# Patient Record
Sex: Female | Born: 1940 | Race: White | Hispanic: No | Marital: Married | Smoking: Never smoker
Health system: Southern US, Community
[De-identification: ages and names within clinical notes are randomized; demographics above are authoritative.]

## PROBLEM LIST (undated history)

## (undated) DIAGNOSIS — B1920 Unspecified viral hepatitis C without hepatic coma: Secondary | ICD-10-CM

## (undated) DIAGNOSIS — H409 Unspecified glaucoma: Secondary | ICD-10-CM

## (undated) DIAGNOSIS — D126 Benign neoplasm of colon, unspecified: Secondary | ICD-10-CM

## (undated) DIAGNOSIS — I10 Essential (primary) hypertension: Secondary | ICD-10-CM

## (undated) HISTORY — PX: COLONOSCOPY: SHX174

## (undated) HISTORY — PX: TONSILLECTOMY: SUR1361

## (undated) HISTORY — DX: Benign neoplasm of colon, unspecified: D12.6

## (undated) HISTORY — DX: Unspecified glaucoma: H40.9

## (undated) HISTORY — DX: Essential (primary) hypertension: I10

## (undated) HISTORY — DX: Unspecified viral hepatitis C without hepatic coma: B19.20

## (undated) SURGERY — Surgical Case
Anesthesia: *Unknown

---

## 2000-06-26 DIAGNOSIS — I10 Essential (primary) hypertension: Secondary | ICD-10-CM

## 2000-06-26 HISTORY — DX: Essential (primary) hypertension: I10

## 2004-08-04 ENCOUNTER — Ambulatory Visit: Payer: Self-pay | Admitting: Internal Medicine

## 2010-05-29 ENCOUNTER — Emergency Department: Payer: Self-pay | Admitting: Emergency Medicine

## 2010-06-26 HISTORY — PX: COLON SURGERY: SHX602

## 2010-11-25 ENCOUNTER — Ambulatory Visit: Payer: Self-pay | Admitting: General Surgery

## 2010-11-29 LAB — PATHOLOGY REPORT

## 2010-12-07 ENCOUNTER — Ambulatory Visit: Payer: Self-pay | Admitting: General Surgery

## 2010-12-14 ENCOUNTER — Inpatient Hospital Stay: Payer: Self-pay | Admitting: General Surgery

## 2011-12-27 ENCOUNTER — Ambulatory Visit: Payer: Self-pay | Admitting: General Surgery

## 2012-03-16 IMAGING — CT CT HEAD WITHOUT CONTRAST
2 series · 16 of 30 positions shown, 20 images · non-contrast
Comparison: none

REASON FOR EXAM: dizzy episodes intermittently esp last 2 days
COMMENTS:

PROCEDURE:     CT  - CT HEAD WITHOUT CONTRAST  - May 29, 2010 [DATE]
RESULT:     Head CT dated 05/29/2010
TECHNIQUE: Helical noncontrasted 5 mm sections were obtained from the skull
base to the vertex.

[Series 2: without · axial · non-contrast · 0.44mm/px · z∈[-116,+8]mm · 13 of 31 slices shown, 17 images]
[im 3/31  brain]
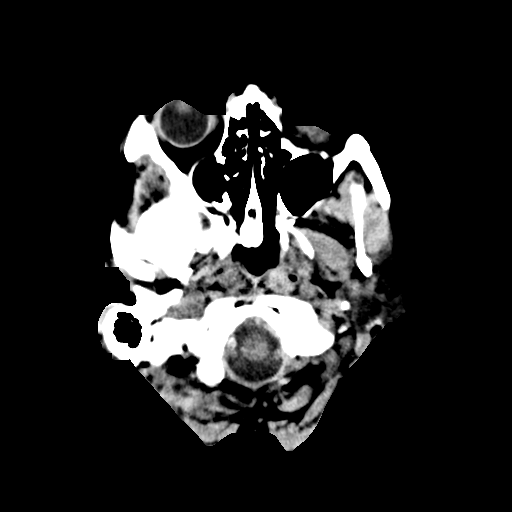
[im 3/31  bone]
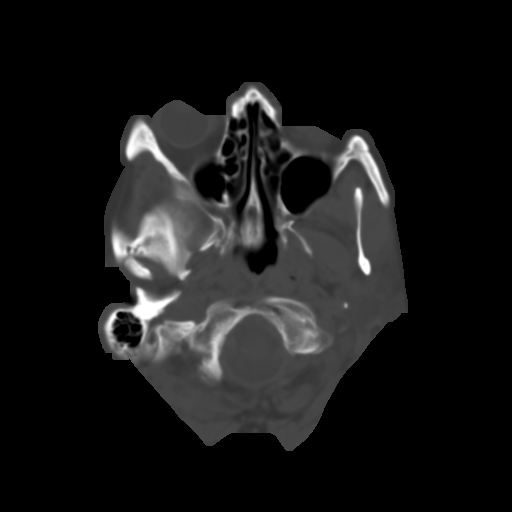
[im 5/31  brain]
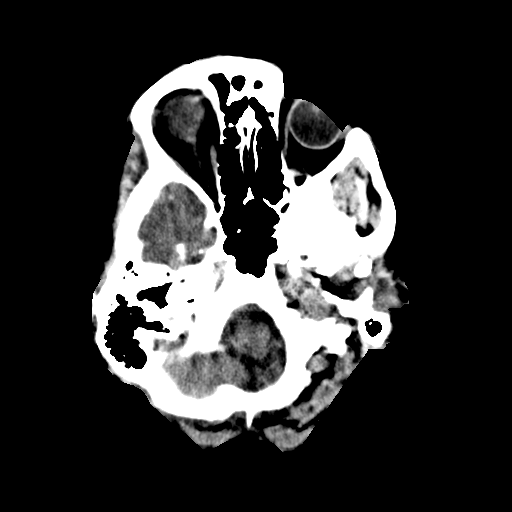
[im 7/31  brain]
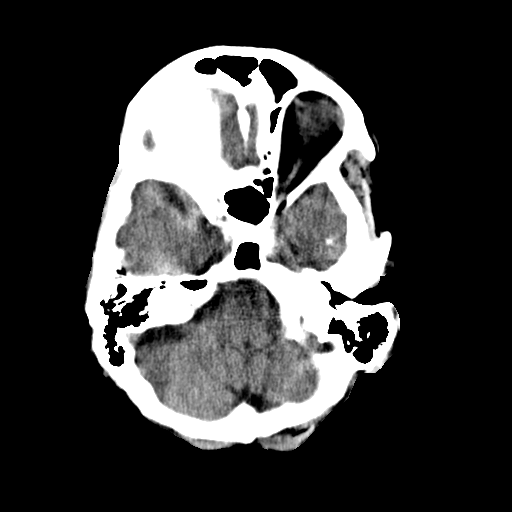
[im 9/31  brain]
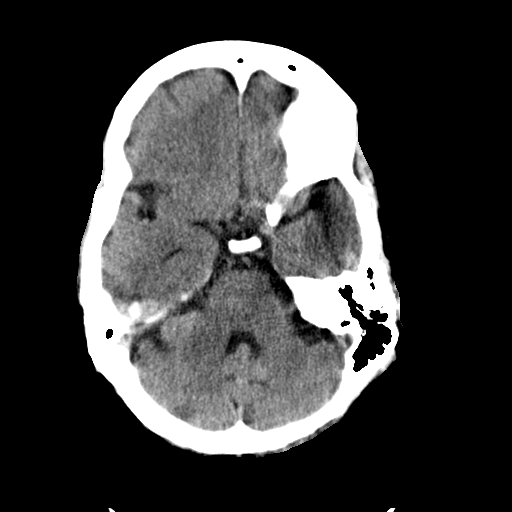
[im 11/31  brain]
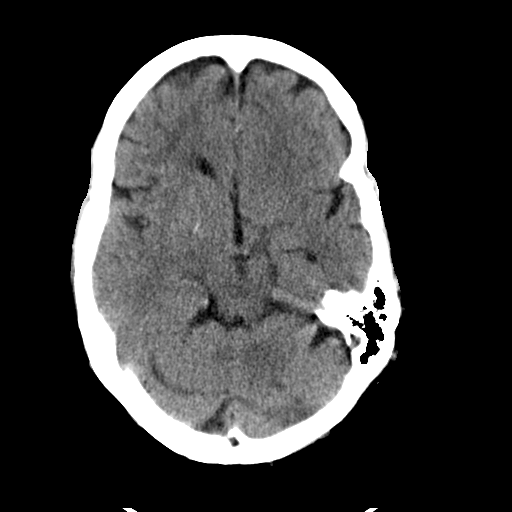
[im 11/31  bone]
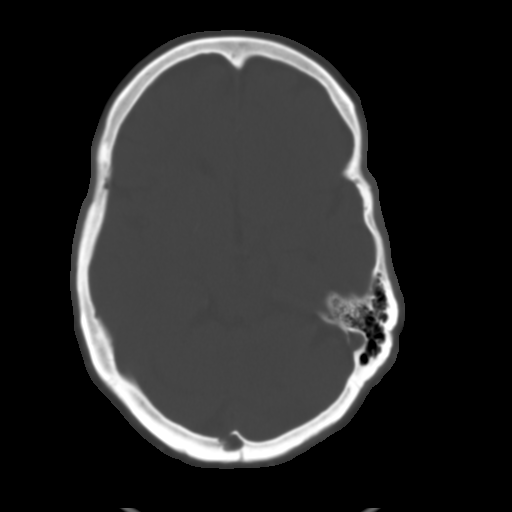
[im 13/31  brain]
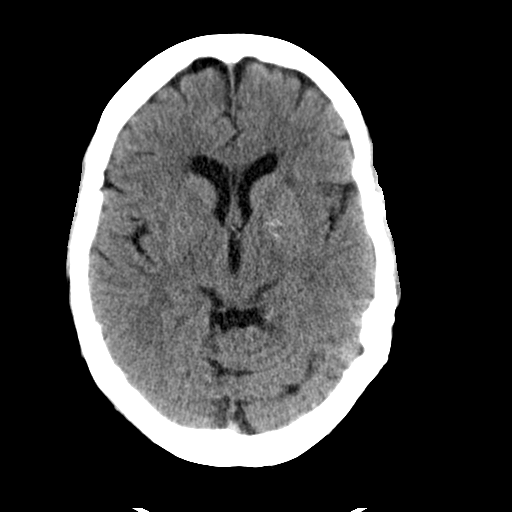
[im 16/31  brain]
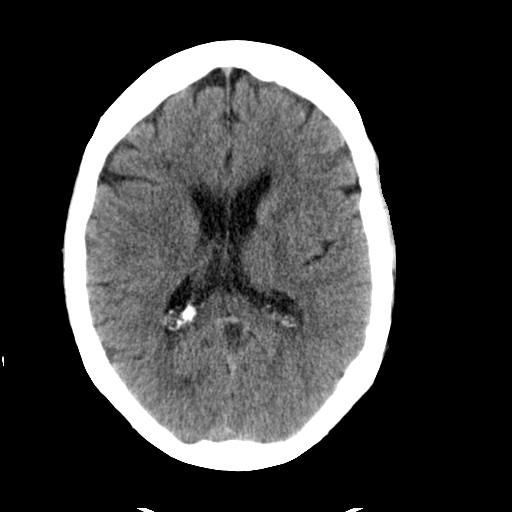
[im 18/31  brain]
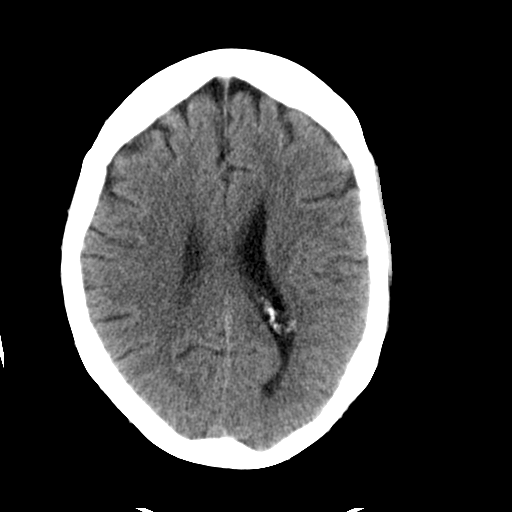
[im 20/31  brain]
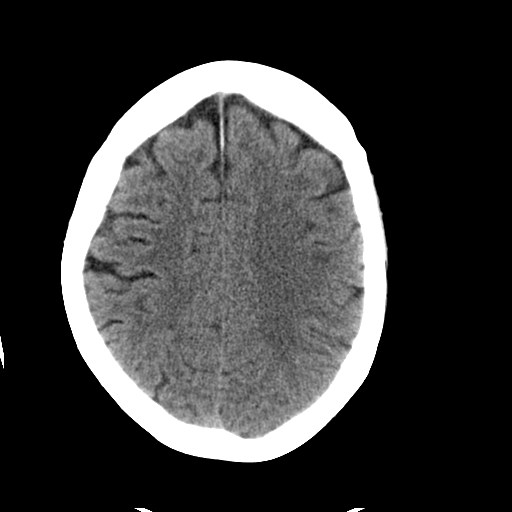
[im 20/31  bone]
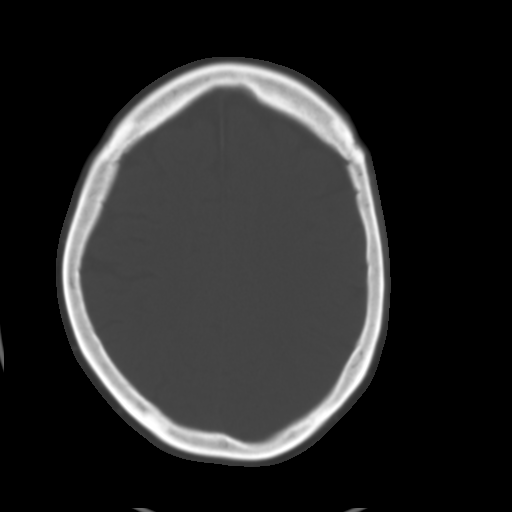
[im 22/31  brain]
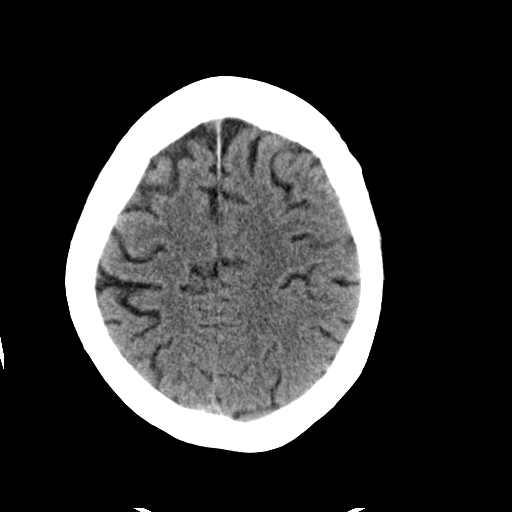
[im 24/31  brain]
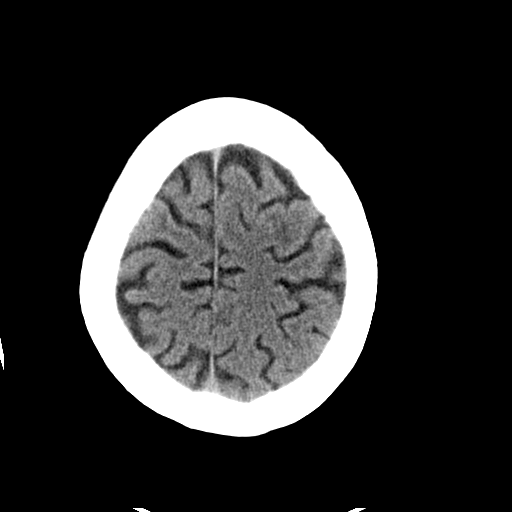
[im 26/31  brain]
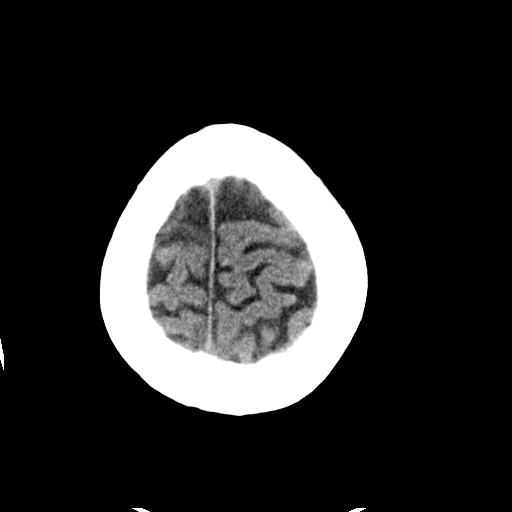
[im 28/31  brain]
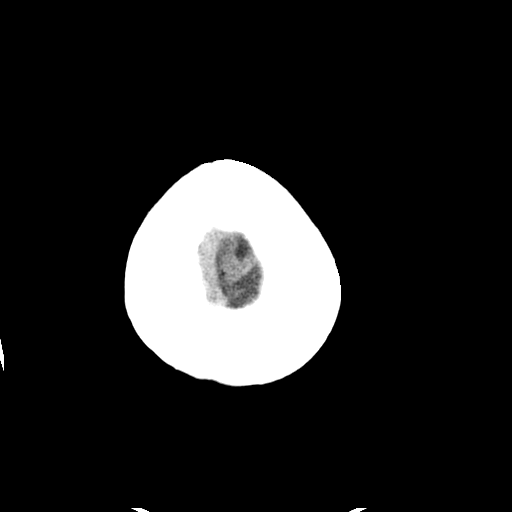
[im 28/31  bone]
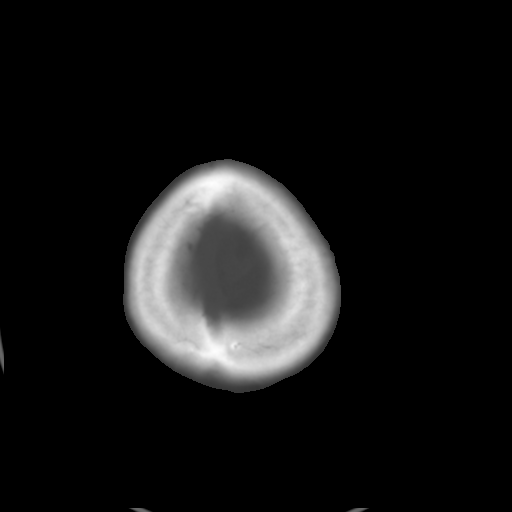

[Series 3: bone · axial · 0.44mm/px · z∈[-116,-76]mm · 3 of 31 slices shown]
[im 3/31  bone]
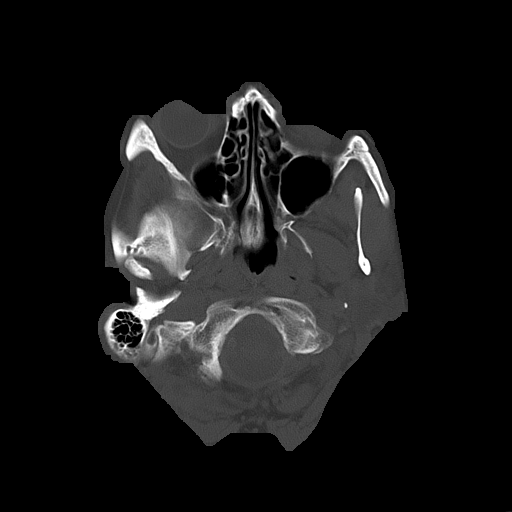
[im 7/31  bone]
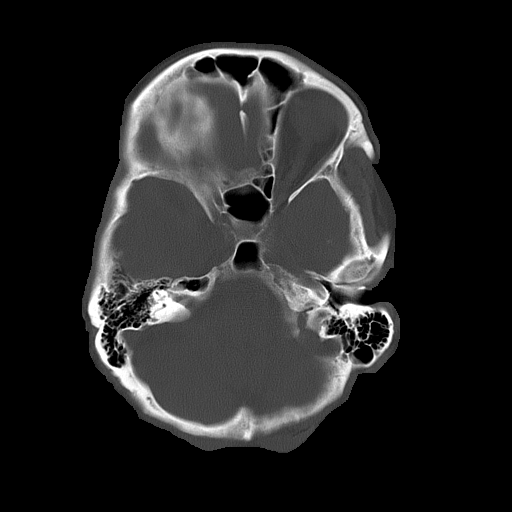
[im 11/31  bone]
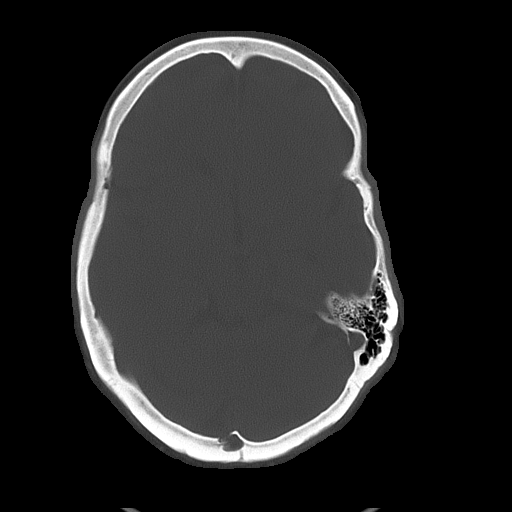

[16 of 30 positions shown; findings below may reference images not displayed]

FINDINGS: There is no evidence of intra-axial nor extra-axial fluid
collections no evidence of acute hemorrhage. Basal ganglial calcifications
identified. Mild areas of low attenuation project within the subcortical,
and deep, and periventricular white matter regions. The ventricles and
cisterns are patent. No evidence of subfalcine or tonsillar herniation. The
pons and cerebellum are grossly unremarkable. No evidence of a depressed
skull fracture.
IMPRESSION: Mild involutional changes evidence of focal or acute
abnormalities.

## 2012-10-07 ENCOUNTER — Encounter: Payer: Self-pay | Admitting: General Surgery

## 2012-12-04 ENCOUNTER — Encounter: Payer: Self-pay | Admitting: General Surgery

## 2012-12-04 ENCOUNTER — Ambulatory Visit (INDEPENDENT_AMBULATORY_CARE_PROVIDER_SITE_OTHER): Payer: Medicare Other | Admitting: General Surgery

## 2012-12-04 VITALS — BP 122/62 | HR 68 | Resp 14 | Ht 63.5 in | Wt 133.0 lb

## 2012-12-04 DIAGNOSIS — Z860100 Personal history of colon polyps, unspecified: Secondary | ICD-10-CM | POA: Insufficient documentation

## 2012-12-04 NOTE — Patient Instructions (Addendum)
Patient to return in 1 year for follow up.  

## 2012-12-04 NOTE — Progress Notes (Signed)
Patient ID: Brenda Robertson, female   DOB: [birth year], 72 y.o.   MRN: [id number]  Chief Complaint  Patient presents with  . Follow-up    colon polyps    HPI Brenda Robertson is a 72 y.o. female who presents for a follow up evaluation for colon polyps. The patient underwent colon resection in 2012 due to large colon polyp in the transverse colon. No new complaints at this time.  HPI  Past Medical History  Diagnosis Date  . Glaucoma   . Hypertension 2002  . Hepatitis C age 45  . Benign neoplasm of colon     Past Surgical History  Procedure Laterality Date  . Tonsillectomy    . Colon surgery  2012    resection  . Colonoscopy  2012    History reviewed. No pertinent family history.  Social History History  Substance Use Topics  . Smoking status: Never Smoker   . Smokeless tobacco: Never Used  . Alcohol Use: No    Allergies  Allergen Reactions  . Augmentin (Amoxicillin-Pot Clavulanate)     Rectal bleeding    Current Outpatient Prescriptions  Medication Sig Dispense Refill  . aspirin 81 MG tablet Take 81 mg by mouth daily.      . Calcium Carbonate-Vitamin D (CALCIUM + D PO) Take 1 tablet by mouth daily.      . fish oil-omega-3 fatty acids 1000 MG capsule Take 2 g by mouth daily.      Marland Kitchen lisinopril (PRINIVIL,ZESTRIL) 5 MG tablet Take 1 tablet by mouth daily.      . Red Yeast Rice Extract (RED YEAST RICE PO) Take 2 tablets by mouth daily.       No current facility-administered medications for this visit.    Review of Systems Review of Systems  Constitutional: Negative.   Respiratory: Negative.   Cardiovascular: Negative.   Gastrointestinal: Negative.     Blood pressure 122/62, pulse 68, resp. rate 14, height 5' 3.5" (1.613 m), weight 133 lb (60.328 kg).  Physical Exam Physical Exam  Constitutional: She appears well-developed and well-nourished.  Eyes: Conjunctivae are normal. No scleral icterus.  Neck: Trachea normal. No mass and no thyromegaly present.   Cardiovascular: Normal rate, regular rhythm, normal heart sounds and normal pulses.   No murmur heard. Pulmonary/Chest: Effort normal and breath sounds normal.  Abdominal: Soft. Normal appearance and bowel sounds are normal. There is no hepatosplenomegaly. There is no tenderness. No hernia.    Data Reviewed  None  Assessment    Stable exam 2 year post colon resection. History of colon polyp.    Plan    Follow up in 1 year. Colonoscopy last yr was normal.       SANKAR,SEEPLAPUTHUR G 12/04/2012, 6:58 PM

## 2013-01-29 ENCOUNTER — Other Ambulatory Visit: Payer: Self-pay

## 2013-05-01 ENCOUNTER — Other Ambulatory Visit: Payer: Self-pay

## 2013-11-20 ENCOUNTER — Ambulatory Visit: Payer: Self-pay | Admitting: General Surgery

## 2013-12-10 ENCOUNTER — Ambulatory Visit (INDEPENDENT_AMBULATORY_CARE_PROVIDER_SITE_OTHER): Payer: Medicare Other | Admitting: General Surgery

## 2013-12-10 ENCOUNTER — Encounter: Payer: Self-pay | Admitting: General Surgery

## 2013-12-10 VITALS — BP 118/56 | HR 62 | Resp 12 | Ht 63.5 in | Wt 129.0 lb

## 2013-12-10 DIAGNOSIS — Z86010 Personal history of colonic polyps: Secondary | ICD-10-CM

## 2013-12-10 DIAGNOSIS — Z800 Family history of malignant neoplasm of digestive organs: Secondary | ICD-10-CM | POA: Insufficient documentation

## 2013-12-10 NOTE — Progress Notes (Signed)
Patient ID: Brenda Robertson, female   DOB: 1940/11/12, 73 y.o.   MRN: [id number]  Chief Complaint  Patient presents with  . Follow-up    colon polyp    HPI Brenda Robertson is a 73 y.o. female.  Here today for follow up colon polyp. She denies any gastrointestinal issues. Bowels are regular and move daily, no blood noted. Last colonoscopy was 2013. 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   HPI  Past Medical History  Diagnosis Date  . Glaucoma   . Hypertension 2002  . Hepatitis C age 12  . Benign neoplasm of colon     Past Surgical History  Procedure Laterality Date  . Tonsillectomy    . Colonoscopy  2012, 2013  . Colon surgery  2012    resection, large tubulovillous adenoma    Family History  Problem Relation Age of Onset  . Cancer Sister 57    colon    Social History History  Substance Use Topics  . Smoking status: Never Smoker   . Smokeless tobacco: Never Used  . Alcohol Use: No    Allergies  Allergen Reactions  . Augmentin [Amoxicillin-Pot Clavulanate]     Rectal bleeding    Current Outpatient Prescriptions  Medication Sig Dispense Refill  . aspirin 81 MG tablet Take 81 mg by mouth daily.      . Calcium Carbonate-Vitamin D (CALCIUM + D PO) Take 1 tablet by mouth daily.      . fish oil-omega-3 fatty acids 1000 MG capsule Take 2 g by mouth daily.      Marland Kitchen lisinopril (PRINIVIL,ZESTRIL) 5 MG tablet Take 1 tablet by mouth daily.      . Red Yeast Rice Extract (RED YEAST RICE PO) Take 2 tablets by mouth daily.       No current facility-administered medications for this visit.    Review of Systems Review of Systems  Constitutional: Negative.   Respiratory: Negative.   Cardiovascular: Negative.   Gastrointestinal: Negative for nausea, vomiting, abdominal pain, diarrhea and constipation.    Blood pressure 118/56, pulse 62, resp. rate 12, height 5' 3.5" (1.613 m), weight 129 lb (58.514 kg).  Physical Exam Physical Exam   Constitutional: She is oriented to person, place, and time. She appears well-developed and well-nourished.  Eyes: Conjunctivae are normal.  Neck: Neck supple.  Cardiovascular: Normal rate, regular rhythm and normal heart sounds.   Pulmonary/Chest: Effort normal and breath sounds normal.  Abdominal: Soft. Bowel sounds are normal. She exhibits no distension. There is no hepatosplenomegaly. There is no tenderness. No hernia.  Incisions well healed.  Lymphadenopathy:    She has no cervical adenopathy.  Neurological: She is alert and oriented to person, place, and time.  Skin: Skin is warm and dry.    Data Reviewed Prior surgery/path  Assessment    History of colon polyps, FH of colon cancer.     Plan    F/U in 1 yr. Plan for colonoscopy next year      PCP: Vernell Morgans 12/10/2013, 12:43 PM

## 2013-12-10 NOTE — Patient Instructions (Signed)
The patient is aware to call back for any questions or concerns.  

## 2014-03-09 ENCOUNTER — Encounter: Payer: Self-pay | Admitting: General Surgery

## 2014-03-09 ENCOUNTER — Ambulatory Visit (INDEPENDENT_AMBULATORY_CARE_PROVIDER_SITE_OTHER): Payer: Medicare Other | Admitting: General Surgery

## 2014-03-09 VITALS — BP 120/68 | HR 60 | Resp 12 | Ht 63.5 in | Wt 129.0 lb

## 2014-03-09 DIAGNOSIS — K5732 Diverticulitis of large intestine without perforation or abscess without bleeding: Secondary | ICD-10-CM

## 2014-03-09 DIAGNOSIS — Z86010 Personal history of colonic polyps: Secondary | ICD-10-CM

## 2014-03-09 DIAGNOSIS — Z800 Family history of malignant neoplasm of digestive organs: Secondary | ICD-10-CM

## 2014-03-09 NOTE — Progress Notes (Signed)
Patient ID: Brenda Robertson, female   DOB: Nov 15, 1940, 73 y.o.   MRN: [id number]  Chief Complaint  Patient presents with  . Other    diverticulitis    HPI Brenda Robertson is a 73 y.o. female here today for Diverticulitis . Bowels are regular and move daily, no blood noted. Last colonoscopy was 2013. 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 The patient states about 1 month ago she had an episode of fever, chills, and left lower abd pain. The patient saw her primary care physician who placed her on antibiotics for 10 days. She responded well to these antibiotics. She had an abdominal CT scan done on 02/04/14. Currently she feels fine.  HPI  Past Medical History  Diagnosis Date  . Glaucoma   . Hypertension 2002  . Hepatitis C age 32  . Benign neoplasm of colon     Past Surgical History  Procedure Laterality Date  . Tonsillectomy    . Colonoscopy  2012, 2013  . Colon surgery  2012    resection, large tubulovillous adenoma    Family History  Problem Relation Age of Onset  . Cancer Sister 12    colon    Social History History  Substance Use Topics  . Smoking status: Never Smoker   . Smokeless tobacco: Never Used  . Alcohol Use: No    Allergies  Allergen Reactions  . Augmentin [Amoxicillin-Pot Clavulanate]     Rectal bleeding    Current Outpatient Prescriptions  Medication Sig Dispense Refill  . aspirin 81 MG tablet Take 81 mg by mouth daily.      . Calcium Carbonate-Vitamin D (CALCIUM + D PO) Take 1 tablet by mouth daily.      . fish oil-omega-3 fatty acids 1000 MG capsule Take 2 g by mouth daily.      Marland Kitchen lisinopril (PRINIVIL,ZESTRIL) 5 MG tablet Take 1 tablet by mouth daily.      . Red Yeast Rice Extract (RED YEAST RICE PO) Take 2 tablets by mouth daily.       No current facility-administered medications for this visit.    Review of Systems Review of Systems  Constitutional: Negative.   Respiratory: Negative.    Cardiovascular: Negative.     Blood pressure 120/68, pulse 60, resp. rate 12, height 5' 3.5" (1.613 m), weight 129 lb (58.514 kg).  Physical Exam Physical Exam  Constitutional: She is oriented to person, place, and time. She appears well-developed and well-nourished.  Neck: Neck supple. No thyromegaly present.  Cardiovascular: Regular rhythm.   No murmur heard. Abdominal: Soft. Normal appearance and bowel sounds are normal. There is no hepatosplenomegaly. There is no tenderness. No hernia.  Neurological: She is alert and oriented to person, place, and time.  Skin: Skin is warm and dry.    Data Reviewed  Reviewed report of abdominal CT Scan that was performed on 02/04/14. She had focal diverticulitis in distal descending colon.  Assessment    Episode of diverticulitis-resolved at present     Plan    Advised on adequate fiber intake. She is to call if she has any recurrence of abdominal pain or other abdominal symptoms. F/u as scheduled.        Kaylyn Garrow G 03/10/2014, 5:25 PM

## 2014-03-09 NOTE — Patient Instructions (Addendum)
Patient to return as scheduled. The patient is aware to call back for any questions or concerns.  

## 2014-03-10 ENCOUNTER — Encounter: Payer: Self-pay | Admitting: General Surgery

## 2014-03-10 ENCOUNTER — Encounter: Payer: Self-pay | Admitting: *Deleted

## 2014-04-27 ENCOUNTER — Encounter: Payer: Self-pay | Admitting: General Surgery

## 2014-12-15 ENCOUNTER — Encounter: Payer: Self-pay | Admitting: General Surgery

## 2014-12-15 ENCOUNTER — Ambulatory Visit (INDEPENDENT_AMBULATORY_CARE_PROVIDER_SITE_OTHER): Payer: Medicare Other | Admitting: General Surgery

## 2014-12-15 VITALS — BP 130/76 | HR 68 | Resp 12 | Ht 63.0 in | Wt 131.0 lb

## 2014-12-15 DIAGNOSIS — Z86010 Personal history of colonic polyps: Secondary | ICD-10-CM

## 2014-12-15 DIAGNOSIS — Z800 Family history of malignant neoplasm of digestive organs: Secondary | ICD-10-CM

## 2014-12-15 MED ORDER — POLYETHYLENE GLYCOL 3350 17 GM/SCOOP PO POWD: 1.0000 | Freq: Once | ORAL | Status: DC

## 2014-12-15 NOTE — Patient Instructions (Addendum)
Colonoscopy A colonoscopy is an exam to look at the entire large intestine (colon). This exam can help find problems such as tumors, polyps, inflammation, and areas of bleeding. The exam takes about 1 hour.  LET [facility] CARE PROVIDER KNOW ABOUT:   Any allergies you have.  All medicines you are taking, including vitamins, herbs, eye drops, creams, and over-the-counter medicines.  Previous problems you or members of your family have had with the use of anesthetics.  Any blood disorders you have.  Previous surgeries you have had.  Medical conditions you have. RISKS AND COMPLICATIONS  Generally, this is a safe procedure. However, as with any procedure, complications can occur. Possible complications include:  Bleeding.  Tearing or rupture of the colon wall.  Reaction to medicines given during the exam.  Infection (rare). BEFORE THE PROCEDURE   Ask your health care provider about changing or stopping your regular medicines.  You may be prescribed an oral bowel prep. This involves drinking a large amount of medicated liquid, starting the day before your procedure. The liquid will cause you to have multiple loose stools until your stool is almost clear or light green. This cleans out your colon in preparation for the procedure.  Do not eat or drink anything else once you have started the bowel prep, unless your health care provider tells you it is safe to do so.  Arrange for someone to drive you home after the procedure. PROCEDURE   You will be given medicine to help you relax (sedative).  You will lie on your side with your knees bent.  A long, flexible tube with a light and camera on the end (colonoscope) will be inserted through the rectum and into the colon. The camera sends video back to a computer screen as it moves through the colon. The colonoscope also releases carbon dioxide gas to inflate the colon. This helps your health care provider see the area better.  During  the exam, your health care provider may take a small tissue sample (biopsy) to be examined under a microscope if any abnormalities are found.  The exam is finished when the entire colon has been viewed. AFTER THE PROCEDURE   Do not drive for 24 hours after the exam.  You may have a small amount of blood in your stool.  You may pass moderate amounts of gas and have mild abdominal cramping or bloating. This is caused by the gas used to inflate your colon during the exam.  Ask when your test results will be ready and how you will get your results. Make sure you get your test results. Document Released: 06/09/2000 Document Revised: 04/02/2013 Document Reviewed: 02/17/2013 [facility] Patient Information 2015 [place], [place]. This information is not intended to replace advice given to you by your health care provider. Make sure you discuss any questions you have with your health care provider.  Patient is scheduled for a Colonoscopy at [facility] on 01/06/15. She is aware to pre register with the hospital at least 2 days prior. She will stop her Fish Oil supplement 1 week prior. She will only take her Lisinopril at 6am with a small sip of water the morning of. Miralax prescription has been sent into her pharmacy. Patient is aware of date and instructions.

## 2014-12-15 NOTE — Progress Notes (Signed)
Patient ID: Brenda Robertson, female   DOB: Oct 10, 1940, 74 y.o.   MRN: [id number]  Chief Complaint  Patient presents with  . Colon Polyps    HPI Brenda Robertson is a 74 y.o. 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 Patient states she is doing well at this time. In September of last year she had an episode of diverticulitis. No recurrence of this.   HPI  Past Medical History  Diagnosis Date  . Glaucoma   . Hypertension 2002  . Hepatitis C age 58  . Benign neoplasm of colon     Past Surgical History  Procedure Laterality Date  . Tonsillectomy    . Colonoscopy  2012, 2013  . Colon surgery  2012    resection, large tubulovillous adenoma    Family History  Problem Relation Age of Onset  . Cancer Sister 61    colon    Social History History  Substance Use Topics  . Smoking status: Never Smoker   . Smokeless tobacco: Never Used  . Alcohol Use: No    Allergies  Allergen Reactions  . Augmentin [Amoxicillin-Pot Clavulanate]     Rectal bleeding    Current Outpatient Prescriptions  Medication Sig Dispense Refill  . aspirin 81 MG tablet Take 81 mg by mouth daily.    . Calcium Carbonate-Vitamin D (CALCIUM + D PO) Take 1 tablet by mouth daily.    . fish oil-omega-3 fatty acids 1000 MG capsule Take 2 g by mouth daily.    Marland Kitchen lisinopril (PRINIVIL,ZESTRIL) 5 MG tablet Take 1 tablet by mouth daily.    . Red Yeast Rice Extract (RED YEAST RICE PO) Take 2 tablets by mouth daily.     No current facility-administered medications for this visit.    Review of Systems Review of Systems  Constitutional: Negative.   Respiratory: Negative.   Cardiovascular: Negative.   Gastrointestinal: Negative.     Blood pressure 130/76, pulse 68, resp. rate 12, height 5\' 3"  (1.6 m), weight 131 lb (59.421 kg).  Physical Exam Physical Exam  Constitutional: She is oriented to person, place, and time. She appears well-developed and  well-nourished.  Eyes: Conjunctivae are normal. No scleral icterus.  Neck: Neck supple.  Cardiovascular: Normal rate, regular rhythm and normal heart sounds.   Pulmonary/Chest: Effort normal and breath sounds normal.  Abdominal: Soft. Bowel sounds are normal. She exhibits no distension and no mass. There is no hepatomegaly. There is no tenderness. There is no rebound and no guarding.    Lymphadenopathy:    She has no cervical adenopathy.  Neurological: She is alert and oriented to person, place, and time.  Skin: Skin is warm and dry.    Data Reviewed Prior notes   Assessment    Personal history of colonic polyp, s/p transverse colon resection 2012. Family history of colon cancer    Plan   Colonoscopy discussed, patient agreeable. If it is normal, next colonoscopy in 5 years.  Colonoscopy with possible biopsy/polypectomy prn: Information regarding the procedure, including its potential risks and complications (including but not limited to perforation of the bowel, which may require emergency surgery to repair, and bleeding) was verbally given to the patient. Educational information regarding lower instestinal endoscopy was given to the patient. Written instructions for how to complete the bowel prep using Miralax were provided. The importance of drinking ample fluids to avoid dehydration as a result of the prep emphasized.     Patient  is scheduled for a Colonoscopy at [facility] on 01/06/15. She is aware to pre register with the hospital at least 2 days prior. She will stop her Fish Oil supplement 1 week prior. She will only take her Lisinopril at 6am with a small sip of water the morning of. Miralax prescription has been sent into her pharmacy. Patient is aware of date and instructions.  Follow up 1 year  PCP:  Candis Schatz G 12/15/2014, 9:09 AM

## 2014-12-24 ENCOUNTER — Encounter: Payer: Self-pay | Admitting: General Surgery

## 2015-01-05 ENCOUNTER — Encounter: Payer: Self-pay | Admitting: *Deleted

## 2015-01-06 ENCOUNTER — Ambulatory Visit: Payer: Medicare Other | Admitting: Anesthesiology

## 2015-01-06 ENCOUNTER — Encounter: Payer: Self-pay | Admitting: *Deleted

## 2015-01-06 ENCOUNTER — Ambulatory Visit
Admission: RE | Admit: 2015-01-06 | Discharge: 2015-01-06 | Disposition: A | Discharge status: Home / Self Care | Payer: Medicare Other | Source: Ambulatory Visit | Attending: General Surgery | Admitting: General Surgery

## 2015-01-06 ENCOUNTER — Encounter
Admission: RE | Disposition: A | Discharge status: Home / Self Care | Payer: Self-pay | Source: Ambulatory Visit | Attending: General Surgery

## 2015-01-06 DIAGNOSIS — Z9049 Acquired absence of other specified parts of digestive tract: Secondary | ICD-10-CM | POA: Diagnosis not present

## 2015-01-06 DIAGNOSIS — Z1211 Encounter for screening for malignant neoplasm of colon: Secondary | ICD-10-CM | POA: Insufficient documentation

## 2015-01-06 DIAGNOSIS — Z800 Family history of malignant neoplasm of digestive organs: Secondary | ICD-10-CM | POA: Insufficient documentation

## 2015-01-06 DIAGNOSIS — I10 Essential (primary) hypertension: Secondary | ICD-10-CM | POA: Insufficient documentation

## 2015-01-06 DIAGNOSIS — Z86010 Personal history of colonic polyps: Secondary | ICD-10-CM | POA: Insufficient documentation

## 2015-01-06 DIAGNOSIS — Z79899 Other long term (current) drug therapy: Secondary | ICD-10-CM | POA: Diagnosis not present

## 2015-01-06 DIAGNOSIS — I252 Old myocardial infarction: Secondary | ICD-10-CM | POA: Diagnosis not present

## 2015-01-06 DIAGNOSIS — K5730 Diverticulosis of large intestine without perforation or abscess without bleeding: Secondary | ICD-10-CM | POA: Diagnosis not present

## 2015-01-06 DIAGNOSIS — B1920 Unspecified viral hepatitis C without hepatic coma: Secondary | ICD-10-CM | POA: Insufficient documentation

## 2015-01-06 DIAGNOSIS — H409 Unspecified glaucoma: Secondary | ICD-10-CM | POA: Insufficient documentation

## 2015-01-06 HISTORY — PX: COLONOSCOPY WITH PROPOFOL: SHX5780

## 2015-01-06 SURGERY — COLONOSCOPY WITH PROPOFOL
Anesthesia: General

## 2015-01-06 MED ORDER — PROPOFOL 10 MG/ML IV BOLUS
INTRAVENOUS | Status: DC | PRN
  Administered 2015-01-06: 50 mg via INTRAVENOUS

## 2015-01-06 MED ORDER — LACTATED RINGERS IV SOLN
INTRAVENOUS | Status: DC
  Administered 2015-01-06: 09:00:00 via INTRAVENOUS

## 2015-01-06 MED ORDER — PROPOFOL INFUSION 10 MG/ML OPTIME
INTRAVENOUS | Status: DC | PRN
  Administered 2015-01-06: 120 ug/kg/min via INTRAVENOUS

## 2015-01-06 NOTE — H&P (View-Only) (Signed)
Patient ID: Brenda Robertson, female   DOB: [birth year], 74 y.o.   MRN: [id number]  Chief Complaint  Patient presents with  . Colon Polyps    HPI Brenda Robertson is a 74 y.o. 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 Patient states she is doing well at this time. In September of last year she had an episode of diverticulitis. No recurrence of this.   HPI  Past Medical History  Diagnosis Date  . Glaucoma   . Hypertension 2002  . Hepatitis C age 7  . Benign neoplasm of colon     Past Surgical History  Procedure Laterality Date  . Tonsillectomy    . Colonoscopy  2012, 2013  . Colon surgery  2012    resection, large tubulovillous adenoma    Family History  Problem Relation Age of Onset  . Cancer Sister 39    colon    Social History History  Substance Use Topics  . Smoking status: Never Smoker   . Smokeless tobacco: Never Used  . Alcohol Use: No    Allergies  Allergen Reactions  . Augmentin [Amoxicillin-Pot Clavulanate]     Rectal bleeding    Current Outpatient Prescriptions  Medication Sig Dispense Refill  . aspirin 81 MG tablet Take 81 mg by mouth daily.    . Calcium Carbonate-Vitamin D (CALCIUM + D PO) Take 1 tablet by mouth daily.    . fish oil-omega-3 fatty acids 1000 MG capsule Take 2 g by mouth daily.    Marland Kitchen lisinopril (PRINIVIL,ZESTRIL) 5 MG tablet Take 1 tablet by mouth daily.    . Red Yeast Rice Extract (RED YEAST RICE PO) Take 2 tablets by mouth daily.     No current facility-administered medications for this visit.    Review of Systems Review of Systems  Constitutional: Negative.   Respiratory: Negative.   Cardiovascular: Negative.   Gastrointestinal: Negative.     Blood pressure 130/76, pulse 68, resp. rate 12, height 5\' 3"  (1.6 m), weight 131 lb (59.421 kg).  Physical Exam Physical Exam  Constitutional: She is oriented to person, place, and time. She appears well-developed and  well-nourished.  Eyes: Conjunctivae are normal. No scleral icterus.  Neck: Neck supple.  Cardiovascular: Normal rate, regular rhythm and normal heart sounds.   Pulmonary/Chest: Effort normal and breath sounds normal.  Abdominal: Soft. Bowel sounds are normal. She exhibits no distension and no mass. There is no hepatomegaly. There is no tenderness. There is no rebound and no guarding.    Lymphadenopathy:    She has no cervical adenopathy.  Neurological: She is alert and oriented to person, place, and time.  Skin: Skin is warm and dry.    Data Reviewed Prior notes   Assessment    Personal history of colonic polyp, s/p transverse colon resection 2012. Family history of colon cancer    Plan   Colonoscopy discussed, patient agreeable. If it is normal, next colonoscopy in 5 years.  Colonoscopy with possible biopsy/polypectomy prn: Information regarding the procedure, including its potential risks and complications (including but not limited to perforation of the bowel, which may require emergency surgery to repair, and bleeding) was verbally given to the patient. Educational information regarding lower instestinal endoscopy was given to the patient. Written instructions for how to complete the bowel prep using Miralax were provided. The importance of drinking ample fluids to avoid dehydration as a result of the prep emphasized.     Patient  is scheduled for a Colonoscopy at [facility] on 01/06/15. She is aware to pre register with the hospital at least 2 days prior. She will stop her Fish Oil supplement 1 week prior. She will only take her Lisinopril at 6am with a small sip of water the morning of. Miralax prescription has been sent into her pharmacy. Patient is aware of date and instructions.  Follow up 1 year  PCP:  Candis Schatz G 12/15/2014, 9:09 AM

## 2015-01-06 NOTE — Op Note (Signed)
[facility] Patient Name: Brenda Robertson Procedure Date: 01/06/2015 9:01 AM MRN: [id number] Account #: [id number] Date of Birth: Jan 30, 1941 Admit Type: Outpatient Age: 74 Room: [facility] ENDO ROOM 1 Gender: Female Note Status: Finalized Procedure:         Colonoscopy Indications:       High risk colon cancer surveillance: Personal history of                     adenoma (10 mm or greater in size) Providers:         Orlie Pollen, MD Medicines:         General Anesthesia Complications:     No immediate complications. Procedure:         Pre-Anesthesia Assessment:                    - General anesthesia under the supervision of an                     anesthesiologist was determined to be medically necessary                     for this procedure based on review of the patient's                     medical history, medications, and prior anesthesia history.                    After obtaining informed consent, the colonoscope was                     passed under direct vision. Throughout the procedure, the                     patient's blood pressure, pulse, and oxygen saturations                     were monitored continuously. The Olympus CF-Q160AL                     colonoscope (S#. [id number] [id number]) was introduced through the anus                     and advanced to the the cecum, identified by the ileocecal                     valve. The Colonoscope was introduced through the and                     advanced to the. The colonoscopy was performed without                     difficulty. The patient tolerated the procedure well. The                     quality of the bowel preparation was good. Findings:      The perianal and digital rectal examinations were normal.      Multiple small and large-mouthed diverticula were found in the sigmoid       colon and in the descending colon.      The exam was otherwise without abnormality. Impression:        -  Diverticulosis in the sigmoid colon and in the  descending colon.                    - The examination was otherwise normal.                    - No specimens collected. Recommendation:    - Repeat colonoscopy in 5 years for surveillance. Procedure Code(s): --- Professional ---                    [phone], Colonoscopy, flexible; diagnostic, including                     collection of specimen(s) by brushing or washing, when                     performed (separate procedure) Diagnosis Code(s): --- Professional ---                    Z86.010, Personal history of colonic polyps                    K57.30, Diverticulosis of large intestine without                     perforation or abscess without bleeding CPT copyright 2014 [employer]. All rights reserved. The codes documented in this report are preliminary and upon coder review may  be revised to meet current compliance requirements. Orlie Pollen, MD 01/06/2015 9:31:55 AM This report has been signed electronically. Number of Addenda: 0 Note Initiated On: 01/06/2015 9:01 AM Scope Withdrawal Time: 0 hours 2 minutes 16 seconds  Total Procedure Duration: 0 hours 14 minutes 12 seconds       [facility]

## 2015-01-06 NOTE — Anesthesia Preprocedure Evaluation (Signed)
Anesthesia Evaluation  Patient identified by MRN, date of birth, ID band Patient awake    Reviewed: Allergy & Precautions, H&P , NPO status , Patient's Chart, lab work & pertinent test results, reviewed documented beta blocker date and time   Airway Mallampati: II  TM Distance: >3 FB Neck ROM: full    Dental no notable dental hx. (+) Teeth Intact   Pulmonary neg pulmonary ROS,  breath sounds clear to auscultation  Pulmonary exam normal       Cardiovascular hypertension, - Past MI Normal cardiovascular examRhythm:regular Rate:Normal     Neuro/Psych negative neurological ROS  negative psych ROS   GI/Hepatic negative GI ROS, Neg liver ROS, (+) Hepatitis -, C  Endo/Other  negative endocrine ROS  Renal/GU negative Renal ROS  negative genitourinary   Musculoskeletal   Abdominal   Peds  Hematology negative hematology ROS (+)   Anesthesia Other Findings Past Medical History:   Glaucoma                                                     Hypertension                                    2002         Hepatitis C                                     age 74       Benign neoplasm of colon                                     Reproductive/Obstetrics negative OB ROS                             Anesthesia Physical Anesthesia Plan  ASA: III  Anesthesia Plan: General   Post-op Pain Management:    Induction:   Airway Management Planned:   Additional Equipment:   Intra-op Plan:   Post-operative Plan:   Informed Consent: I have reviewed the patients History and Physical, chart, labs and discussed the procedure including the risks, benefits and alternatives for the proposed anesthesia with the patient or authorized representative who has indicated his/her understanding and acceptance.   Dental Advisory Given  Plan Discussed with: Anesthesiologist, CRNA and Surgeon  Anesthesia Plan Comments:          Anesthesia Quick Evaluation

## 2015-01-06 NOTE — Anesthesia Postprocedure Evaluation (Signed)
  Anesthesia Post-op Note  Patient: Brenda Robertson  Procedure(s) Performed: Procedure(s): COLONOSCOPY WITH PROPOFOL (N/A)  Anesthesia type:General  Patient location: PACU  Post pain: Pain level controlled  Post assessment: Post-op Vital signs reviewed, Patient's Cardiovascular Status Stable, Respiratory Function Stable, Patent Airway and No signs of Nausea or vomiting  Post vital signs: Reviewed and stable  Last Vitals:  Filed Vitals:   01/06/15 1005  BP: 144/73  Pulse: 61  Temp:   Resp: 16    Level of consciousness: awake, alert  and patient cooperative  Complications: No apparent anesthesia complications

## 2015-01-06 NOTE — Transfer of Care (Signed)
Immediate Anesthesia Transfer of Care Note  Patient: Brenda Robertson  Procedure(s) Performed: Procedure(s): COLONOSCOPY WITH PROPOFOL (N/A)  Patient Location: PACU and Endoscopy Unit  Anesthesia Type:General  Level of Consciousness: awake, alert  and oriented  Airway & Oxygen Therapy: Patient Spontanous Breathing and Patient connected to nasal cannula oxygen  Post-op Assessment: Report given to RN and Post -op Vital signs reviewed and stable  Post vital signs: Reviewed and stable  Last Vitals:  Filed Vitals:   01/06/15 0747  BP: 142/66  Pulse: 60  Temp: 35.9 C  Resp: 16    Complications: No apparent anesthesia complications

## 2015-01-06 NOTE — Interval H&P Note (Signed)
History and Physical Interval Note:  01/06/2015 9:10 AM  Brenda Robertson  has presented today for surgery, with the diagnosis of Family History colon Cancer and PH colon polyps  The various methods of treatment have been discussed with the patient and family. After consideration of risks, benefits and other options for treatment, the patient has consented to  Procedure(s): COLONOSCOPY WITH PROPOFOL (N/A) as a surgical intervention .  The patient's history has been reviewed, patient examined, no change in status, stable for surgery.  I have reviewed the patient's chart and labs.  Questions were answered to the patient's satisfaction.     SANKAR,SEEPLAPUTHUR G

## 2015-06-01 ENCOUNTER — Telehealth: Payer: Self-pay | Admitting: *Deleted

## 2015-06-01 NOTE — Telephone Encounter (Signed)
Patient had a colonoscopy 12/2014, she is having some rectal pain and wanted to speak with a nurse. (Offererd appt, she rather ask nurse first)

## 2015-06-02 NOTE — Telephone Encounter (Signed)
appt for 06-08-15

## 2015-06-08 ENCOUNTER — Encounter: Payer: Self-pay | Admitting: General Surgery

## 2015-06-08 ENCOUNTER — Ambulatory Visit (INDEPENDENT_AMBULATORY_CARE_PROVIDER_SITE_OTHER): Payer: Medicare Other | Admitting: General Surgery

## 2015-06-08 VITALS — BP 124/68 | HR 72 | Resp 12 | Ht 63.0 in | Wt 132.0 lb

## 2015-06-08 DIAGNOSIS — K640 First degree hemorrhoids: Secondary | ICD-10-CM | POA: Diagnosis not present

## 2015-06-08 MED ORDER — HYDROCORTISONE ACETATE 25 MG RE SUPP: 25.0000 mg | Freq: Two times a day (BID) | RECTAL | Status: DC

## 2015-06-08 NOTE — Patient Instructions (Signed)
Patient to return as needed. 

## 2015-06-08 NOTE — Progress Notes (Signed)
Patient ID: Brenda Robertson, female   DOB: [birth year], 74 y.o.   MRN: [id number]  Chief Complaint  Patient presents with  . Rectal Pain    HPI Brenda Robertson is a 74 y.o. female here today for a evaluation of rectal symptom with bowel movements. Last colonoscopy was done 01/09/2015-normal except for diverticulosis. She states the right side of her rectal area feels different with bowel movements. No bleeding. No pain I have reviewed the history of present illness with the patient.  HPI  Past Medical History  Diagnosis Date  . Glaucoma   . Hypertension 2002  . Hepatitis C age 24  . Benign neoplasm of colon     Past Surgical History  Procedure Laterality Date  . Tonsillectomy    . Colonoscopy  2012, 2013  . Colon surgery  2012    resection, large tubulovillous adenoma  . Colonoscopy with propofol N/A 01/06/2015    Procedure: COLONOSCOPY WITH PROPOFOL;  Surgeon: Christene Lye, MD;  Location: [facility] ENDOSCOPY;  Service: Endoscopy;  Laterality: N/A;    Family History  Problem Relation Age of Onset  . Cancer Sister 44    colon    Social History Social History  Substance Use Topics  . Smoking status: Never Smoker   . Smokeless tobacco: Never Used  . Alcohol Use: No    Allergies  Allergen Reactions  . Augmentin [Amoxicillin-Pot Clavulanate]     Rectal bleeding    Current Outpatient Prescriptions  Medication Sig Dispense Refill  . aspirin 81 MG tablet Take 81 mg by mouth daily.    . Calcium Carbonate-Vitamin D (CALCIUM + D PO) Take 1 tablet by mouth daily.    . fish oil-omega-3 fatty acids 1000 MG capsule Take 2 g by mouth daily.    . hydrocortisone (ANUSOL-HC) 25 MG suppository Place 1 suppository (25 mg total) rectally 2 (two) times daily. 12 suppository 0  . lisinopril (PRINIVIL,ZESTRIL) 5 MG tablet Take 1 tablet by mouth daily.    . Red Yeast Rice Extract (RED YEAST RICE PO) Take 2 tablets by mouth daily.     No current facility-administered medications for this  visit.    Review of Systems Review of Systems  Constitutional: Negative.   Respiratory: Negative.   Cardiovascular: Negative.     Blood pressure 124/68, pulse 72, resp. rate 12, height 5\' 3"  (1.6 m), weight 132 lb (59.875 kg).  Physical Exam Physical Exam  Constitutional: She is oriented to person, place, and time. She appears well-developed and well-nourished.  Abdominal: Soft. There is no tenderness.  Genitourinary: Rectal exam shows no external hemorrhoid, no fissure, no mass, no tenderness and anal tone normal.  Neurological: She is alert and oriented to person, place, and time.  Skin: Skin is warm and dry.   Anoscopy was completed with pt in left lateral position.  It showed a 1 cm internal hemorrhoid on right side. Not inflamed, no bleeding. No other findings Data Reviewed   Assessment    Internal hemorrhoid.     Plan   Rx sent for Anusol HC supp to use for 1-2 weeks. Pt advised on finding.  Patient to return as needed.     PCP:  Margarette Asal 06/08/2015, 2:57 PM

## 2016-03-01 ENCOUNTER — Encounter: Payer: Self-pay | Admitting: General Surgery

## 2016-03-01 ENCOUNTER — Ambulatory Visit (INDEPENDENT_AMBULATORY_CARE_PROVIDER_SITE_OTHER): Payer: Medicare Other | Admitting: General Surgery

## 2016-03-01 VITALS — BP 140/70 | HR 66 | Resp 12 | Ht 63.5 in | Wt 132.0 lb

## 2016-03-01 DIAGNOSIS — K648 Other hemorrhoids: Secondary | ICD-10-CM

## 2016-03-01 DIAGNOSIS — K625 Hemorrhage of anus and rectum: Secondary | ICD-10-CM | POA: Diagnosis not present

## 2016-03-01 DIAGNOSIS — Z86010 Personal history of colonic polyps: Secondary | ICD-10-CM

## 2016-03-01 LAB — POC HEMOCCULT BLD/STL (OFFICE/1-CARD/DIAGNOSTIC): FECAL OCCULT BLD: NEGATIVE

## 2016-03-01 NOTE — Progress Notes (Signed)
Patient ID: Brenda Robertson, female   DOB: 06-15-1941, 75 y.o.   MRN: [id number]  Chief Complaint  Patient presents with  . Rectal Bleeding    HPI Brenda Robertson is a 75 y.o. female.  Here today for evaluation of rectal bleeding. She states rectal bleeding was yesterday only, none today. Bowels move daily and she does not admit to be constipated. Last colonoscopy was done 01/09/2015-normal except for diverticulosis. Last visit was for internal hemorrhoid, December 2016. I have reviewed the history of present illness with the patient.  HPI  Past Medical History:  Diagnosis Date  . Benign neoplasm of colon   . Glaucoma   . Hepatitis C age 55  . Hypertension 2002    Past Surgical History:  Procedure Laterality Date  . COLON SURGERY  2012   transverse colon resection, large tubulovillous adenoma  . COLONOSCOPY  2012, 2013  . COLONOSCOPY WITH PROPOFOL N/A 01/06/2015   Procedure: COLONOSCOPY WITH PROPOFOL;  Surgeon: Christene Lye, MD;  Location: [facility] ENDOSCOPY;  Service: Endoscopy;  Laterality: N/A;  . TONSILLECTOMY      Family History  Problem Relation Age of Onset  . Cancer Sister 36    colon    Social History Social History  Substance Use Topics  . Smoking status: Never Smoker  . Smokeless tobacco: Never Used  . Alcohol use No    Allergies  Allergen Reactions  . Augmentin [Amoxicillin-Pot Clavulanate]     Rectal bleeding    Current Outpatient Prescriptions  Medication Sig Dispense Refill  . aspirin 81 MG tablet Take 81 mg by mouth daily.    . Calcium Carbonate-Vitamin D (CALCIUM + D PO) Take 1 tablet by mouth daily.    . fish oil-omega-3 fatty acids 1000 MG capsule Take 2 g by mouth daily.    Marland Kitchen lisinopril (PRINIVIL,ZESTRIL) 5 MG tablet Take 1 tablet by mouth daily.    . Red Yeast Rice Extract (RED YEAST RICE PO) Take 2 tablets by mouth daily.     No current facility-administered medications for this visit.     Review of Systems Review of Systems   Constitutional: Negative.   Cardiovascular: Negative.   Gastrointestinal: Positive for anal bleeding.    Blood pressure 140/70, pulse 66, resp. rate 12, height 5' 3.5" (1.613 m), weight 132 lb (59.9 kg).  Physical Exam Physical Exam  Constitutional: She is oriented to person, place, and time. She appears well-developed and well-nourished.  Abdominal: Soft. Bowel sounds are normal. There is no tenderness.  Genitourinary: Rectal exam shows internal hemorrhoid. Rectal exam shows guaiac negative stool.  Genitourinary Comments: Right side internal hemorrhoid.  Neurological: She is alert and oriented to person, place, and time.  Skin: Skin is warm and dry.  Psychiatric: Her behavior is normal.  Anoscopy was performed. No bleeding or pathology noted. Except for a small internal hemorrhoid on right side  Data Reviewed Prior notes.  Assessment    Internal hemorrhoid. No active bleeding noted.Stool is heme negative.  Pt advised. Likely bleeding was from minor tear in hemorrhoid. If bleeding recurs will reevaluate  Plan    Follow up as needed. Call for further bleeding. The patient is aware to call back for any questions or concerns.      This information has been scribed by Karie Fetch RN, BSN,BC.   Joliana Claflin G 03/03/2016, 3:37 PM

## 2016-03-01 NOTE — Patient Instructions (Addendum)
The patient is aware to call back for any questions or concerns. Call for further bleeding

## 2016-03-03 ENCOUNTER — Encounter: Payer: Self-pay | Admitting: General Surgery
